# Patient Record
Sex: Male | Born: 2000
Health system: Southern US, Community
[De-identification: ages and names within clinical notes are randomized; demographics above are authoritative.]

## PROBLEM LIST (undated history)

## (undated) HISTORY — PX: CERVICAL DISC SURGERY: SHX588

---
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
## 2019-04-01 ENCOUNTER — Other Ambulatory Visit: Payer: Self-pay

## 2019-04-01 DIAGNOSIS — Z20822 Contact with and (suspected) exposure to covid-19: Secondary | ICD-10-CM

## 2019-04-03 LAB — NOVEL CORONAVIRUS, NAA: SARS-CoV-2, NAA: NOT DETECTED
